# Patient Record
Sex: Male | Born: 1970 | Race: White | Hispanic: No | Marital: Single | Smoking: Never smoker
Health system: Southern US, Community
[De-identification: ages and names within clinical notes are randomized; demographics above are authoritative.]

## PROBLEM LIST (undated history)

## (undated) DIAGNOSIS — G4733 Obstructive sleep apnea (adult) (pediatric): Secondary | ICD-10-CM

## (undated) DIAGNOSIS — Q2730 Arteriovenous malformation, site unspecified: Secondary | ICD-10-CM

## (undated) DIAGNOSIS — E041 Nontoxic single thyroid nodule: Secondary | ICD-10-CM

## (undated) DIAGNOSIS — J309 Allergic rhinitis, unspecified: Secondary | ICD-10-CM

## (undated) DIAGNOSIS — N471 Phimosis: Secondary | ICD-10-CM

## (undated) DIAGNOSIS — G2581 Restless legs syndrome: Secondary | ICD-10-CM

## (undated) DIAGNOSIS — L719 Rosacea, unspecified: Secondary | ICD-10-CM

## (undated) DIAGNOSIS — E782 Mixed hyperlipidemia: Secondary | ICD-10-CM

## (undated) DIAGNOSIS — E669 Obesity, unspecified: Secondary | ICD-10-CM

## (undated) HISTORY — PX: APPENDECTOMY: SHX54

## (undated) HISTORY — DX: Nontoxic single thyroid nodule: E04.1

## (undated) HISTORY — DX: Mixed hyperlipidemia: E78.2

## (undated) HISTORY — DX: Obesity, unspecified: E66.9

## (undated) HISTORY — PX: NASAL SEPTUM SURGERY: SHX37

## (undated) HISTORY — DX: Allergic rhinitis, unspecified: J30.9

## (undated) HISTORY — DX: Rosacea, unspecified: L71.9

## (undated) HISTORY — DX: Restless legs syndrome: G25.81

## (undated) HISTORY — DX: Arteriovenous malformation, site unspecified: Q27.30

## (undated) HISTORY — DX: Phimosis: N47.1

## (undated) HISTORY — DX: Obstructive sleep apnea (adult) (pediatric): G47.33

## (undated) HISTORY — PX: OTHER SURGICAL HISTORY: SHX169

---

## 2006-08-01 ENCOUNTER — Encounter: Admission: RE | Admit: 2006-08-01 | Discharge: 2006-08-01 | Payer: Self-pay | Admitting: Otolaryngology

## 2006-08-16 ENCOUNTER — Other Ambulatory Visit: Admission: RE | Admit: 2006-08-16 | Discharge: 2006-08-16 | Payer: Self-pay | Admitting: Otolaryngology

## 2006-09-12 ENCOUNTER — Ambulatory Visit (HOSPITAL_COMMUNITY): Admission: RE | Admit: 2006-09-12 | Discharge: 2006-09-13 | Payer: Self-pay | Admitting: Otolaryngology

## 2006-09-12 ENCOUNTER — Encounter (INDEPENDENT_AMBULATORY_CARE_PROVIDER_SITE_OTHER): Payer: Self-pay | Admitting: Specialist

## 2009-02-16 ENCOUNTER — Ambulatory Visit: Payer: Self-pay | Admitting: Orthopedic Surgery

## 2009-02-16 DIAGNOSIS — M7580 Other shoulder lesions, unspecified shoulder: Secondary | ICD-10-CM

## 2009-02-16 DIAGNOSIS — M25819 Other specified joint disorders, unspecified shoulder: Secondary | ICD-10-CM | POA: Insufficient documentation

## 2009-02-16 DIAGNOSIS — M25519 Pain in unspecified shoulder: Secondary | ICD-10-CM | POA: Insufficient documentation

## 2009-02-22 ENCOUNTER — Encounter: Payer: Self-pay | Admitting: Orthopedic Surgery

## 2009-05-18 ENCOUNTER — Encounter: Admission: RE | Admit: 2009-05-18 | Discharge: 2009-05-18 | Payer: Self-pay | Admitting: Orthopedic Surgery

## 2009-08-25 ENCOUNTER — Observation Stay (HOSPITAL_COMMUNITY): Admission: EM | Admit: 2009-08-25 | Discharge: 2009-08-26 | Payer: Self-pay | Admitting: Emergency Medicine

## 2009-08-26 ENCOUNTER — Encounter (INDEPENDENT_AMBULATORY_CARE_PROVIDER_SITE_OTHER): Payer: Self-pay | Admitting: Emergency Medicine

## 2010-10-02 ENCOUNTER — Encounter: Payer: Self-pay | Admitting: Family Medicine

## 2010-10-03 ENCOUNTER — Encounter: Payer: Self-pay | Admitting: Orthopedic Surgery

## 2010-12-13 LAB — BASIC METABOLIC PANEL
Creatinine, Ser: 0.9 mg/dL (ref 0.4–1.5)
GFR calc Af Amer: >60 mL/min (ref >60)
GFR calc non Af Amer: >60 mL/min (ref >60)

## 2010-12-13 LAB — POCT CARDIAC MARKERS
CKMB, poc: 1.9 ng/mL (ref 1.0–8.0)
Myoglobin, poc: 187 ng/mL (ref 12–200)

## 2010-12-13 LAB — D-DIMER, QUANTITATIVE: D-Dimer, Quant: 0.63 ug/mL-FEU — ABNORMAL HIGH (ref 0.00–0.48)

## 2010-12-13 LAB — CBC
Hemoglobin: 14.3 g/dL (ref 13.0–17.0)
MCHC: 34.2 g/dL (ref 30.0–36.0)
MCV: 84.3 fL (ref 78.0–100.0)
Platelets: 234 10*3/uL (ref 150–400)
RBC: 4.94 MIL/uL (ref 4.22–5.81)
RDW: 12.9 % (ref 11.5–15.5)

## 2010-12-13 LAB — CK TOTAL AND CKMB (NOT AT ARMC)
CK, MB: 2.6 ng/mL (ref 0.3–4.0)
CK, MB: 3 ng/mL (ref 0.3–4.0)
Relative Index: 1.3 (ref 0.0–2.5)
Total CK: 206 U/L (ref 7–232)
Total CK: 238 U/L — ABNORMAL HIGH (ref 7–232)

## 2010-12-13 LAB — DIFFERENTIAL
Basophils Relative: 1 % (ref 0–1)
Neutro Abs: 4.7 10*3/uL (ref 1.7–7.7)

## 2010-12-13 LAB — TROPONIN I: Troponin I: 0.01 ng/mL (ref 0.00–0.06)

## 2011-01-27 NOTE — Op Note (Signed)
Don Mcdonald, Don Mcdonald               ACCOUNT NO.:  [id number]   MEDICAL RECORD NO.:  [id number]          PATIENT TYPE:  OIB   LOCATION:  2550                         FACILITY:  [facility]   PHYSICIAN:  Jefry H. Pollyann Kennedy, MD     DATE OF BIRTH:  05-29-1971   DATE OF PROCEDURE:  09/12/2006  DATE OF DISCHARGE:                               OPERATIVE REPORT   PREOPERATIVE DIAGNOSIS:  Left thyroid mass.   POSTOPERATIVE DIAGNOSIS:  Left thyroid mass.   PROCEDURE:  Left thyroid lobectomy.   SURGEON:  Jefry H. Pollyann Kennedy, MD.   ASSISTANTGloris Manchester. Lazarus Salines, M.D.   COMPLICATIONS:  None.   BLOOD LOSS:  None.   FINDINGS:  Large circumscribed mass approximately 3.5 to 4 cm involving  the majority of the left thyroid lobe with a subcentimeter prelaryngeal  lymph node present just to the right of midline.  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   FINAL DIAGNOSIS:  Deferred for permanent section evaluation.   REFERRING PHYSICIAN:  Dr. Johnn Hai.   HISTORY:  This is a 40 year old with an incidentally found left thyroid  mass.  It was solid and solitary on ultrasound and needle aspiration  biopsy revealed follicular epithelium.  Risks, benefits, alternatives,  complications of procedure explained to the patient, who seemed to  understand and agreed to surgery.   PROCEDURE:  The patient was taken to the operating room, placed on the  operating table in supine position.  Following induction of general  endotracheal anesthesia, the patient was prepped and draped in standard  fashion.  A low collar incision was outlined with marking pen and  electrocautery was used to incise the skin and through the subcutaneous  tissue and through the superficial fascia and platysma.  Subplatysmal  flaps were developed superiorly up to the thyroid notch and inferiorly  to the clavicle.  A self-retaining thyroid retractor was used for the  remainder of  the case.  The midline fascia was divided and the strap  muscles were dissected off of the left thyroid lobe.  The left thyroid  lobe was then dissected in a pericapsular plane initially clamping off  and tying the superior vasculature.  A putative parathyroid was  identified and preserved with its blood supply adjacent to the entry  point of the recurrent nerve into the larynx on the left.  The middle  thyroid vein was ligated between clamps and divided.  The inferior  vasculature was then similarly ligated and divided.  The ligament of  Allyson Sabal was divided using electrocautery.  Small vessels were coagulated  with cautery unit.  The recurrent nerve was identified and preserved.  The isthmus was divided using electrocautery.  The left lobe was sent  for pathologic evaluation.  The prelaryngeal lymph node that was  identified was simply dissected out using cautery and was sent  separately with the specimen.  The wound was irrigated with saline and  hemostasis was completed using bipolar cautery as needed.  The wound was  closed in layers using 3-0 chromic on the midline  fascia and the  platysma layer and Dermabond on the skin.  Subcuticular closure was also  accomplished using the chromic.  A 7-French round JP drain was left in  the wound, exited through the right side of the incision, secured in  place with a nylon suture.  The patient was awakened from anesthesia,  extubated, transferred to recovery in stable condition.      Jefry H. Pollyann Kennedy, MD  Electronically Signed     JHR/MEDQ  D:  09/12/2006  T:  09/12/2006  Job:  [id number]   cc:   Dr. Johnn Hai

## 2013-11-07 ENCOUNTER — Other Ambulatory Visit: Payer: Self-pay | Admitting: Sports Medicine

## 2013-11-07 DIAGNOSIS — M25551 Pain in right hip: Secondary | ICD-10-CM

## 2013-11-28 ENCOUNTER — Ambulatory Visit
Admission: RE | Admit: 2013-11-28 | Discharge: 2013-11-28 | Disposition: A | Discharge status: Home / Self Care | Payer: No Typology Code available for payment source | Source: Ambulatory Visit | Attending: Sports Medicine | Admitting: Sports Medicine

## 2013-11-28 DIAGNOSIS — M25551 Pain in right hip: Secondary | ICD-10-CM

## 2013-11-28 MED ORDER — IOHEXOL 180 MG/ML  SOLN
15.0000 mL | Freq: Once | INTRAMUSCULAR | Status: AC | PRN
  Administered 2013-11-28: 15 mL via INTRA_ARTICULAR

## 2014-02-13 ENCOUNTER — Encounter: Payer: Self-pay | Admitting: Internal Medicine

## 2014-02-13 ENCOUNTER — Ambulatory Visit (INDEPENDENT_AMBULATORY_CARE_PROVIDER_SITE_OTHER): Payer: No Typology Code available for payment source | Admitting: Internal Medicine

## 2014-02-13 ENCOUNTER — Encounter (INDEPENDENT_AMBULATORY_CARE_PROVIDER_SITE_OTHER): Payer: Self-pay

## 2014-02-13 VITALS — BP 122/74 | HR 75 | Ht 75.0 in | Wt 382.0 lb

## 2014-02-13 DIAGNOSIS — G4733 Obstructive sleep apnea (adult) (pediatric): Secondary | ICD-10-CM | POA: Insufficient documentation

## 2014-02-13 DIAGNOSIS — G4700 Insomnia, unspecified: Secondary | ICD-10-CM

## 2014-02-13 MED ORDER — CLONAZEPAM 0.5 MG PO TABS: ORAL_TABLET | ORAL | Status: DC

## 2014-02-13 NOTE — Assessment & Plan Note (Signed)
CPAP 12 appearss to be effective and well tolerated

## 2014-02-13 NOTE — Progress Notes (Signed)
02/13/14 42 yoM never smoker FOLLOWS FOR: Referred by Dr. Paulino Rily for inability to stay asleep throughout night. Pt states he wakes up during the night and has a hard time falling back to sleep. Epworth= 2 Known OSA on CPAP12/ [facility]- comfortable and used all night every night. Complains of difficulty initiating and maintaining sleep x 20 years. Bedtime 8-9 PM. Gets anxious about ability to sleep as bedtime approaches. Latency 1/2 hr, then wakes 3-4 times/ night for ?why? 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 Not much day-time sleepines. No caffeine. Occ brief nap- wakes spontaneously. If can't sleep will lie in bed, because if he gets up to read "that's it".  Bedroom cool, dark, quiet, bed comfortable, bed partner not disruptive. Has tried restoril, Vernon Prey  Prior to Admission medications   Medication Sig Start Date End Date Taking? Authorizing Provider  clonazePAM (KLONOPIN) 0.5 MG tablet 1-4 tabs, 1 hour before bedtime, if needed for sleep 02/13/14   Waymon Budge, MD   Past Medical History  Diagnosis Date  . Obesity   . Allergic rhinitis   . Left thyroid nodule     excision of nodule in 2008  . Phimosis   . OSA (obstructive sleep apnea)   . Rosacea   . RLS (restless legs syndrome)   . AVM (arteriovenous malformation)     right avm  . Mixed hyperlipidemia    Past Surgical History  Procedure Laterality Date  . Thyroid nodule      removal 2008  . Appendectomy    . Nasal septum surgery     Family History  Problem Relation Age of Onset  . Heart failure Father   . Diabetes Mother    History   Social History  . Marital Status: Single    Spouse Name: N/A    Number of Children: N/A  . Years of Education: N/A   Occupational History  . controller    Social History Main Topics  . Smoking status: Never Smoker   . Smokeless tobacco: Never Used  . Alcohol Use: No  . Drug Use: No  . Sexual Activity: Not on file    Other Topics Concern  . Not on file   Social History Narrative  . No narrative on file   ROS-see HPI Constitutional:   No-   weight loss, night sweats, fevers, chills, fatigue, lassitude. HEENT:   No-  headaches, difficulty swallowing, tooth/dental problems, sore throat,       No-  sneezing, itching, ear ache, nasal congestion, post nasal drip,  CV:  No-   chest pain, orthopnea, PND, swelling in lower extremities, anasarca,                                  dizziness, palpitations Resp: No-   shortness of breath with exertion or at rest.              No-   productive cough,  No non-productive cough,  No- coughing up of blood.              No-   change in color of mucus.  No- wheezing.   Skin: No-   rash or lesions. GI:  No-   heartburn, indigestion, abdominal pain, nausea, vomiting, diarrhea,                 change in bowel habits, loss  of appetite GU: No-   dysuria, change in color of urine, no urgency or frequency.  No- flank pain. MS:  No-   joint pain or swelling.  No- decreased range of motion.  No- back pain. Neuro-     nothing unusual Psych:  No- change in mood or affect. No depression or +anxiety.  No memory loss.  OBJ- Physical Exam- Tall/ very big man General- Alert, Oriented, Affect-appropriate, Distress- none acute Skin- rash-none, lesions- none, excoriation- none. Ruddy complexion Lymphadenopathy- none Head- atraumatic            Eyes- Gross vision intact, PERRLA, conjunctivae and secretions clear            Ears- Hearing, canals-normal            Nose- Clear, no-Septal dev, mucus, polyps, erosion, perforation             Throat- Mallampati II-III , mucosa clear , drainage- none, tonsils- atrophic Neck- flexible , trachea midline, no stridor , thyroid nl, carotid no bruit Chest - symmetrical excursion , unlabored           Heart/CV- RRR , no murmur , no gallop  , no rub, nl s1 s2                           - JVD- none , edema- none, stasis changes- none, varices-  none           Lung- clear to P&A, wheeze- none, cough- none , dullness-none, rub- none           Chest wall-  Abd- tender-no, distended-no, bowel sounds-present, HSM- no Br/ Gen/ Rectal- Not done, not indicated Extrem- cyanosis- none, clubbing, none, atrophy- none, strength- nl Neuro- grossly intact to observation

## 2014-02-13 NOTE — Assessment & Plan Note (Signed)
Sleep schedule 8PM-3AM suggests possible sleep schedule problem but he doesn't know that he would choose a different schedule. Sleep environment ok. Uncontrolled OSA often associated with nonspecific awakenings, but that doesn't seem true here. Plan- Try clonazepam for longer half life. Anticiptate he may need higher than usual dose due to his size. Consider adding more anxiolytic later. Consider cognitive behavioral therapy.

## 2014-02-13 NOTE — Patient Instructions (Signed)
Script for clonazepam. Try taking about an hour before bedtime. Check with [place] if this dose range doesn't seem right for you.

## 2014-02-27 ENCOUNTER — Encounter: Payer: Self-pay | Admitting: Internal Medicine

## 2014-02-27 MED ORDER — CLONAZEPAM 1 MG PO TABS: ORAL_TABLET | ORAL | Status: AC

## 2014-02-27 NOTE — Telephone Encounter (Signed)
klonpin 0.5 mg was last refilled 02/13/14 #30 x 1 refill.  1-4 tabs, 1 hour before bedtime, if needed for sleep Pending appt 03/30/14  Please advise Dr. Maple HudsonYoung thanks

## 2014-02-27 NOTE — Addendum Note (Signed)
Addended by: Tommie SamsSILVA, MINDY S on: 02/27/2014 03:44 PM   Modules accepted: Orders

## 2014-02-27 NOTE — Telephone Encounter (Signed)
I have sent email to pt.  I called RX into [facility]. Nothing further needed

## 2014-02-27 NOTE — Telephone Encounter (Signed)
Change klonopin script to 1 mg tabs, # 90, 1.5 -3 tabs per night for sleep as directed, ref x 3

## 2014-03-30 ENCOUNTER — Ambulatory Visit (INDEPENDENT_AMBULATORY_CARE_PROVIDER_SITE_OTHER): Payer: No Typology Code available for payment source | Admitting: Internal Medicine

## 2014-03-30 ENCOUNTER — Telehealth: Payer: Self-pay | Admitting: Internal Medicine

## 2014-03-30 ENCOUNTER — Encounter: Payer: Self-pay | Admitting: Internal Medicine

## 2014-03-30 VITALS — BP 120/86 | HR 108 | Ht 75.0 in | Wt 384.0 lb

## 2014-03-30 DIAGNOSIS — G4700 Insomnia, unspecified: Secondary | ICD-10-CM

## 2014-03-30 DIAGNOSIS — G4733 Obstructive sleep apnea (adult) (pediatric): Secondary | ICD-10-CM

## 2014-03-30 NOTE — Patient Instructions (Signed)
Order- [facility] - needs replacement for broken CPAP machine 12 cwp, mask of choice, humidifier, supplies        Dx OSA  Ok for now to continue clonazepam for sleep when needed. Call for refill if needed before next visit

## 2014-03-30 NOTE — Progress Notes (Signed)
02/13/14 42 yoM never smoker FOLLOWS FOR: Referred by Dr. Paulino RilyWolters for inability to stay asleep throughout night. Pt states he wakes up during the night and has a hard time falling back to sleep. Epworth= 2 Known OSA on CPAP12/ [facility]- comfortable and used all night every night. Complains of difficulty initiating and maintaining sleep x 20 years. Bedtime 8-9 PM. Gets anxious about ability to sleep as bedtime approaches. Latency 1/2 hr, then wakes 3-4 times/ night for ?why? 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 Not much day-time sleepines. No caffeine. Occ brief nap- wakes spontaneously. If can't sleep will lie in bed, because if he gets up to read "that's it".  Bedroom cool, dark, quiet, bed comfortable, bed partner not disruptive. Has tried restoril, Vernon Preyambien, lunesta  03/30/14- 42 yoM never smoker followed for Insomnia, OSA CPAP 12/ [facility] FOLLOW FOR:  Still waking up during the night but not as much; believes the medication is helping just overwhelmed at work. When calms down believes will be better.  Would continue with medication Broke CPAP machine12/ needs replacement. Clonazepam working better than [place] or [place].   ROS-see HPI Constitutional:   No-   weight loss, night sweats, fevers, chills, fatigue, lassitude. HEENT:   No-  headaches, difficulty swallowing, tooth/dental problems, sore throat,       No-  sneezing, itching, ear ache, nasal congestion, post nasal drip,  CV:  No-   chest pain, orthopnea, PND, swelling in lower extremities, anasarca,                                  dizziness, palpitations Resp: No-   shortness of breath with exertion or at rest.              No-   productive cough,  No non-productive cough,  No- coughing up of blood.              No-   change in color of mucus.  No- wheezing.   Skin: No-   rash or lesions. GI:  No-   heartburn, indigestion, abdominal pain, nausea, vomiting, GU: . MS:  No-   joint  pain or swelling.  Neuro-     nothing unusual Psych:  No- change in mood or affect. No depression or +anxiety.  No memory loss.  OBJ- Physical Exam- Tall/ very big man General- Alert, Oriented, Affect-appropriate, Distress- none acute Skin- rash-none, lesions- none, excoriation- none. Ruddy complexion Lymphadenopathy- none Head- atraumatic            Eyes- Gross vision intact, PERRLA, conjunctivae and secretions clear            Ears- Hearing, canals-normal            Nose- Clear, no-Septal dev, mucus, polyps, erosion, perforation             Throat- Mallampati II-III , mucosa clear , drainage- none, tonsils- atrophic Neck- flexible , trachea midline, no stridor , thyroid nl, carotid no bruit Chest - symmetrical excursion , unlabored           Heart/CV- RRR , no murmur , no gallop  , no rub, nl s1 s2                           - JVD- none , edema- none, stasis changes- none, varices- none  Lung- clear to P&A, wheeze- none, cough- none , dullness-none, rub- none           Chest wall-  Abd-  Br/ Gen/ Rectal- Not done, not indicated Extrem- cyanosis- none, clubbing, none, atrophy- none, strength- nl Neuro- grossly intact to observation

## 2014-03-30 NOTE — Telephone Encounter (Signed)
Pt has appt today with CY at 2:45 Pt aware that this can be addressed at his OV [facility] working with Dr Maple HudsonYoung today.  Has appt today but also needs Rx for cpap machine. Wanted to go ahead & leave message to see if could get process started. Uses [facility].   Nothing further needed.

## 2014-06-08 ENCOUNTER — Encounter: Payer: Self-pay | Admitting: Internal Medicine

## 2014-06-08 NOTE — Telephone Encounter (Signed)
If he has not tried [place], offer lunesta 2 mg, # 30, 1 for sleep as needed. It will likely cost more and may leave an aftertaste. If he has tried [place], then ask if he has ever used Trazodone, Seroquel or flurazepam.

## 2014-06-08 NOTE — Telephone Encounter (Signed)
Please advise on E-mail Dr Maple Hudson thanks

## 2014-06-09 NOTE — Telephone Encounter (Signed)
Offer trial of Seroquel 50 mg # 50 1 at bedtime first night, increasing if needed- 2 at hs second night, 3 on third night, 4 on 4th night, up to max of 6 per night.

## 2014-06-09 NOTE — Telephone Encounter (Addendum)
Dr Maple HudsonYoung please see e-mail.  Pt has tried and failed trazadone but has not taken the other two meds listed below.  Please advise which med you would like to Rx Thanks.

## 2014-06-09 NOTE — Telephone Encounter (Signed)
lmomtcb x 1 to make the pt aware of CY recs.

## 2014-06-10 MED ORDER — QUETIAPINE FUMARATE 50 MG PO TABS: ORAL_TABLET | ORAL | Status: DC

## 2014-06-10 NOTE — Telephone Encounter (Signed)
Spoke with pt and advised of Dr Young's recommendations.  Rx called to pharmacy. 

## 2014-06-10 NOTE — Addendum Note (Signed)
Addended by: Abigail MiyamotoPHELPS, Tramain Gershman D on: 06/10/2014 09:27 AM   Modules accepted: Orders

## 2014-06-18 ENCOUNTER — Encounter: Payer: Self-pay | Admitting: Internal Medicine

## 2014-06-18 NOTE — Telephone Encounter (Signed)
Please advise Dr. Young thanks 

## 2014-06-18 NOTE — Telephone Encounter (Signed)
If no better or worse with Seroquel, then stop it and lets watch off medication to see if any of the meds has really made any difference

## 2014-07-07 NOTE — Assessment & Plan Note (Signed)
Trying clonazepam instead of ambien

## 2014-07-07 NOTE — Assessment & Plan Note (Signed)
Plan- discuss w DME - replacement CPAP at 12

## 2014-07-08 NOTE — Telephone Encounter (Signed)
10.28 mychart message from pt: Message     I have not noticed any difference in my sleeping since going off the Seroquel.   Will forward to CY to make him aware there is no change in pt's sleep since stopping the Seroquel.  Pt is aware of this.

## 2014-07-09 NOTE — Telephone Encounter (Signed)
If sleep quality was not better with clonazepam or seroquel, then he can try Rx flurazepam/ [place] 30 mg, # 15, 1 at bedtime as needed. No ref for now.

## 2014-07-10 MED ORDER — FLURAZEPAM HCL 30 MG PO CAPS: 30.0000 mg | ORAL_CAPSULE | Freq: Every evening | ORAL | Status: DC | PRN

## 2014-07-10 NOTE — Addendum Note (Signed)
Addended by: Reynaldo MiniumWELCHEL, KATIE C on: 07/10/2014 10:47 AM   Modules accepted: Orders

## 2014-07-24 NOTE — Telephone Encounter (Signed)
Ok to try on and off remaining supply of flurazepam/ Dal[place]e. If  meds don't make a difference, then I don't have another to offer at this time.

## 2014-09-02 ENCOUNTER — Encounter: Payer: Self-pay | Admitting: Internal Medicine

## 2014-09-29 ENCOUNTER — Ambulatory Visit: Payer: No Typology Code available for payment source | Admitting: Internal Medicine

## 2015-01-26 ENCOUNTER — Encounter: Payer: Self-pay | Admitting: Internal Medicine

## 2015-01-27 ENCOUNTER — Other Ambulatory Visit: Payer: Self-pay | Admitting: Internal Medicine

## 2015-01-27 DIAGNOSIS — G4733 Obstructive sleep apnea (adult) (pediatric): Secondary | ICD-10-CM

## 2015-01-28 ENCOUNTER — Encounter: Payer: Self-pay | Admitting: Internal Medicine

## 2015-01-28 ENCOUNTER — Ambulatory Visit (INDEPENDENT_AMBULATORY_CARE_PROVIDER_SITE_OTHER): Payer: No Typology Code available for payment source | Admitting: Internal Medicine

## 2015-01-28 VITALS — BP 126/74 | HR 92 | Ht 75.0 in | Wt 398.0 lb

## 2015-01-28 DIAGNOSIS — G4700 Insomnia, unspecified: Secondary | ICD-10-CM | POA: Diagnosis not present

## 2015-01-28 DIAGNOSIS — G4733 Obstructive sleep apnea (adult) (pediatric): Secondary | ICD-10-CM | POA: Diagnosis not present

## 2015-01-28 MED ORDER — SUVOREXANT 20 MG PO TABS: 1.0000 | ORAL_TABLET | Freq: Every day | ORAL | Status: AC

## 2015-01-28 NOTE — Assessment & Plan Note (Signed)
Pressure and compliance seem good. We're going to continue these settings. Weight loss is encouraged.

## 2015-01-28 NOTE — Progress Notes (Signed)
02/13/14 42 yoM never smoker FOLLOWS FOR: Referred by Dr. Paulino RilyWolters for inability to stay asleep throughout night. Pt states he wakes up during the night and has a hard time falling back to sleep. Epworth= 2 Known OSA on CPAP12/ [facility]- comfortable and used all night every night. Complains of difficulty initiating and maintaining sleep x 20 years. Bedtime 8-9 PM. Gets anxious about ability to sleep as bedtime approaches. Latency 1/2 hr, then wakes 3-4 times/ night for ?why? 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 Not much day-time sleepines. No caffeine. Occ brief nap- wakes spontaneously. If can't sleep will lie in bed, because if he gets up to read "that's it".  Bedroom cool, dark, quiet, bed comfortable, bed partner not disruptive. Has tried restoril, [place] Preyambien, lunesta  03/30/14- 42 yoM never smoker followed for Insomnia, OSA CPAP 12/ [facility] FOLLOW FOR:  Still waking up during the night but not as much; believes the medication is helping just overwhelmed at work. When calms down believes will be better.  Would continue with medication Broke CPAP machine12/ needs replacement. Clonazepam working better than [place] or [place].   01/28/15- 42 yoM never smoker followed for Insomnia, OSA CPAP 12/ [facility] Same issues not sleeping through the night,clonazepam 1-3 mg is not helping  Fustrating and anxiety. Has tried restoril, ambien, lunesta, clonazepam, trazadone. Feels okay with CPAP at 12/[facility]. Problems falling asleep and maintaining sleep. We reviewed his experience with sleep medications and reemphasized basic sleep hygiene Breathing is okay. Sometimes right nostril clogs up. He has seen ENT about this. Occasional use of decongestant nasal spray.  ROS-see HPI Constitutional:   No-   weight loss, night sweats, fevers, chills, fatigue, lassitude. HEENT:   No-  headaches, difficulty swallowing, tooth/dental problems, sore throat,       No-   sneezing, itching, ear ache, nasal congestion, post nasal drip,  CV:  No-   chest pain, orthopnea, PND, swelling in lower extremities, anasarca,                                  dizziness, palpitations Resp: No-   shortness of breath with exertion or at rest.              No-   productive cough,  No non-productive cough,  No- coughing up of blood.              No-   change in color of mucus.  No- wheezing.   Skin: No-   rash or lesions. GI:  No-   heartburn, indigestion, abdominal pain, nausea, vomiting, GU: . MS:  No-   joint pain or swelling.  Neuro-     nothing unusual Psych:  No- change in mood or affect. No depression or +anxiety.  No memory loss.  OBJ- Physical Exam- Tall/ very big man, + obese, jiggling legs General- Alert, Oriented, Affect-appropriate, Distress- none acute Skin- rash-none, lesions- none, excoriation- none. Ruddy complexion Lymphadenopathy- none Head- atraumatic            Eyes- Gross vision intact, PERRLA, conjunctivae and secretions clear            Ears- Hearing, canals-normal            Nose- Clear, no-Septal dev, mucus, polyps, erosion, perforation             Throat- Mallampati II-III , mucosa clear , drainage- none,  tonsils- atrophic Neck- flexible , trachea midline, no stridor , thyroid nl, carotid no bruit Chest - symmetrical excursion , unlabored           Heart/CV- RRR , no murmur , no gallop  , no rub, nl s1 s2                           - JVD- none , edema- none, stasis changes- none, varices- none           Lung- clear to P&A, wheeze- none, cough- none , dullness-none, rub- none           Chest wall-  Abd-  Br/ Gen/ Rectal- Not done, not indicated Extrem- cyanosis- none, clubbing, none, atrophy- none, strength- nl Neuro- grossly intact to observation

## 2015-01-28 NOTE — Assessment & Plan Note (Signed)
Significant difficulty initiating and maintaining sleep. Appropriate counseling done. Any CPAP mask discomfort will only aggravate this. He has not found good solution with Restoril, Ambien, Lunesta, clonazepam, trazodone. Plan-discussed mechanisms of action. Try samples Belsomra 20 mg. Once he knows how this feels, he may try adding 1 mg of clonazepam for sedative effect if necessary.

## 2015-01-28 NOTE — Patient Instructions (Signed)
Samples Belsomra 20 mg at bedtime. If needed, you can try adding 1 mg Clonazepam, but I wouldn't add more than that for now.  We can continue CPAP 12/ [facility]  Order- CPAP download pressure compliance  Dx OSA  Please call as needed

## 2015-02-01 ENCOUNTER — Encounter: Payer: Self-pay | Admitting: Internal Medicine

## 2015-02-04 ENCOUNTER — Telehealth: Payer: Self-pay | Admitting: Internal Medicine

## 2015-02-04 MED ORDER — DOXEPIN HCL 25 MG PO CAPS: 25.0000 mg | ORAL_CAPSULE | Freq: Every day | ORAL | Status: AC

## 2015-02-04 NOTE — Telephone Encounter (Signed)
Called made pt aware of recs. When tryingt o send in doxepin for 20 mg it comes in 25 mg. Is this okay? thanks

## 2015-02-04 NOTE — Telephone Encounter (Signed)
RX sent in. Nothing further needed 

## 2015-02-04 NOTE — Telephone Encounter (Signed)
Use Doxepin 25 mg, # 30, 1 for sleep at bedtime, ref x 3

## 2015-02-04 NOTE — Telephone Encounter (Signed)
Per 01/28/15 OV; Patient Instructions       Samples Belsomra 20 mg at bedtime. If needed, you can try adding 1 mg Clonazepam, but I wouldn't add more than that for now  --  Spoke with pt. He reports the belsomra did nothing for him. On the 3rd day on, he added 1 mg of clonazepam and still did nothing for him. Requesting recs. Please advise CDY thanks  No allergies   Current Outpatient Prescriptions on File Prior to Visit  Medication Sig Dispense Refill  . clonazePAM (KLONOPIN) 1 MG tablet 1.5 -3 tabs per night for sleep as directed 90 tablet 3  . Suvorexant (BELSOMRA) 20 MG TABS Take 1 tablet by mouth daily. 6 tablet 0   No current facility-administered medications on file prior to visit.

## 2015-02-04 NOTE — Telephone Encounter (Signed)
Offer doxepin 20 mg # 30, 1 at bedtime for sleep

## 2015-02-12 ENCOUNTER — Encounter: Payer: Self-pay | Admitting: Internal Medicine

## 2015-02-12 NOTE — Telephone Encounter (Signed)
CY - please advise. Thanks! 

## 2015-02-18 NOTE — Telephone Encounter (Signed)
What medicine has he taken that he would like to back to? Who is he seeing now for psychiatrist?

## 2015-03-31 ENCOUNTER — Encounter: Payer: Self-pay | Admitting: Internal Medicine

## 2015-06-24 ENCOUNTER — Other Ambulatory Visit: Payer: Self-pay | Admitting: Family Medicine

## 2015-06-24 DIAGNOSIS — N50819 Testicular pain, unspecified: Secondary | ICD-10-CM

## 2015-06-24 DIAGNOSIS — N509 Disorder of male genital organs, unspecified: Principal | ICD-10-CM

## 2015-07-12 ENCOUNTER — Ambulatory Visit
Admission: RE | Admit: 2015-07-12 | Discharge: 2015-07-12 | Disposition: A | Discharge status: Home / Self Care | Payer: No Typology Code available for payment source | Source: Ambulatory Visit | Attending: Family Medicine | Admitting: Family Medicine

## 2015-07-12 DIAGNOSIS — N509 Disorder of male genital organs, unspecified: Principal | ICD-10-CM

## 2015-07-12 DIAGNOSIS — N50819 Testicular pain, unspecified: Secondary | ICD-10-CM

## 2015-08-02 ENCOUNTER — Ambulatory Visit: Payer: No Typology Code available for payment source | Admitting: Internal Medicine

## 2018-05-10 DIAGNOSIS — Z79899 Other long term (current) drug therapy: Secondary | ICD-10-CM | POA: Diagnosis not present

## 2018-05-10 DIAGNOSIS — E782 Mixed hyperlipidemia: Secondary | ICD-10-CM | POA: Diagnosis not present

## 2018-05-10 DIAGNOSIS — Z23 Encounter for immunization: Secondary | ICD-10-CM | POA: Diagnosis not present

## 2018-05-10 DIAGNOSIS — E041 Nontoxic single thyroid nodule: Secondary | ICD-10-CM | POA: Diagnosis not present

## 2018-05-10 DIAGNOSIS — E8881 Metabolic syndrome: Secondary | ICD-10-CM | POA: Diagnosis not present

## 2018-05-10 DIAGNOSIS — Z0000 Encounter for general adult medical examination without abnormal findings: Secondary | ICD-10-CM | POA: Diagnosis not present

## 2018-11-15 ENCOUNTER — Other Ambulatory Visit: Payer: Self-pay | Admitting: Orthopedic Surgery

## 2018-11-15 DIAGNOSIS — M25512 Pain in left shoulder: Secondary | ICD-10-CM | POA: Diagnosis not present

## 2018-11-15 DIAGNOSIS — M25511 Pain in right shoulder: Secondary | ICD-10-CM | POA: Diagnosis not present

## 2018-11-19 ENCOUNTER — Ambulatory Visit
Admission: RE | Admit: 2018-11-19 | Discharge: 2018-11-19 | Disposition: A | Discharge status: Home / Self Care | Payer: BLUE CROSS/BLUE SHIELD | Source: Ambulatory Visit | Attending: Orthopedic Surgery | Admitting: Orthopedic Surgery

## 2018-11-19 DIAGNOSIS — M25511 Pain in right shoulder: Secondary | ICD-10-CM

## 2019-07-22 DIAGNOSIS — E782 Mixed hyperlipidemia: Secondary | ICD-10-CM | POA: Diagnosis not present

## 2019-07-22 DIAGNOSIS — Z0000 Encounter for general adult medical examination without abnormal findings: Secondary | ICD-10-CM | POA: Diagnosis not present

## 2019-07-22 DIAGNOSIS — Z23 Encounter for immunization: Secondary | ICD-10-CM | POA: Diagnosis not present

## 2019-07-22 DIAGNOSIS — E041 Nontoxic single thyroid nodule: Secondary | ICD-10-CM | POA: Diagnosis not present

## 2019-07-22 DIAGNOSIS — Z79899 Other long term (current) drug therapy: Secondary | ICD-10-CM | POA: Diagnosis not present

## 2019-07-22 DIAGNOSIS — R7303 Prediabetes: Secondary | ICD-10-CM | POA: Diagnosis not present

## 2020-07-29 IMAGING — MR MRI OF THE RIGHT SHOULDER WITHOUT CONTRAST
5 series · 35 of 40 positions shown · non-contrast
Comparison: None.

CLINICAL DATA: Chronic right shoulder pain and limited range of
motion. No known injury.

EXAM:
MRI OF THE RIGHT SHOULDER WITHOUT CONTRAST
TECHNIQUE: Multiplanar, multisequence MR imaging of the shoulder was performed.
No intravenous contrast was administered.

[Series 3: PD fat-sat · axial · 4.0mm · 0.55mm/px · z∈[+28,+123]mm · 8 of 23 slices shown (1 of 2)]
[im 1/23]
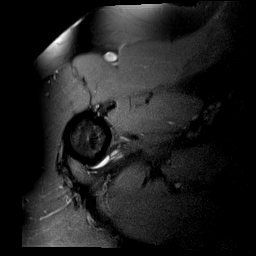
[im 4/23]
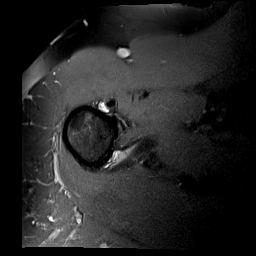
[im 7/23]
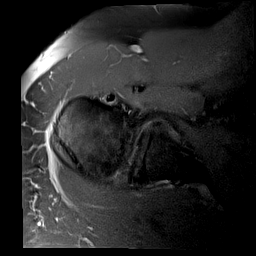
[im 10/23]
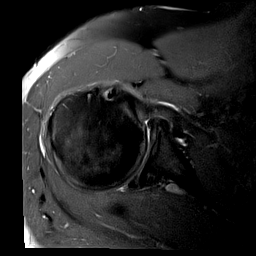
[im 13/23]
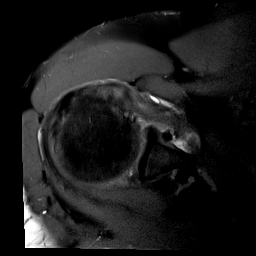
[im 16/23]
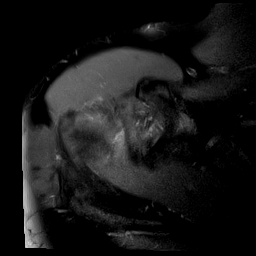
[im 19/23]
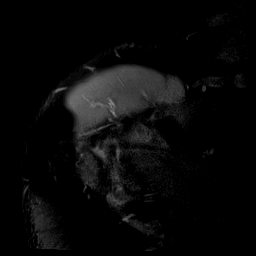
[im 23/23]
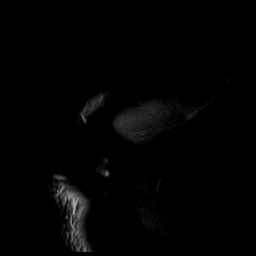

[Series 5: PD fat-sat · coronal · 4.0mm · 0.27mm/px · 8 of 19 slices shown (2 of 2)]
[im 1/19]
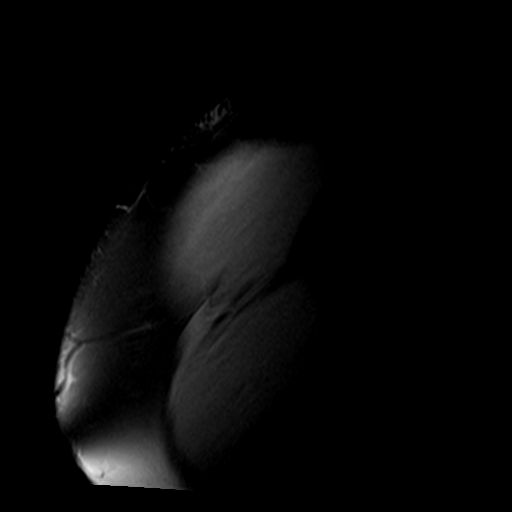
[im 3/19]
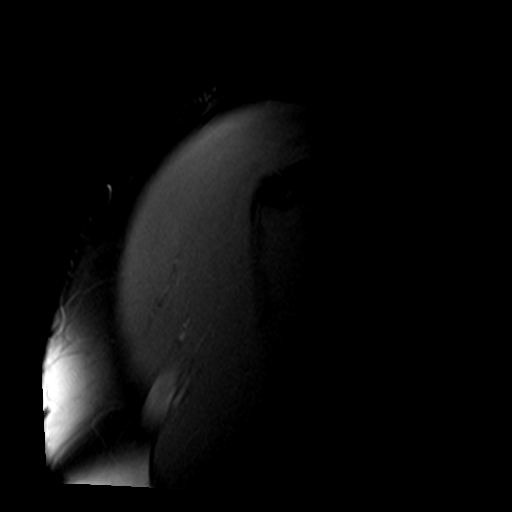
[im 6/19]
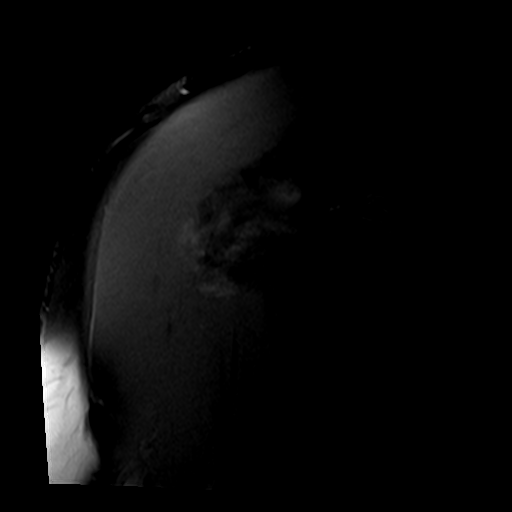
[im 8/19]
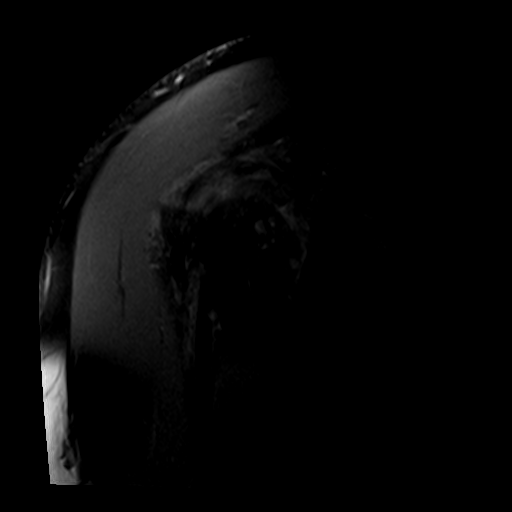
[im 11/19]
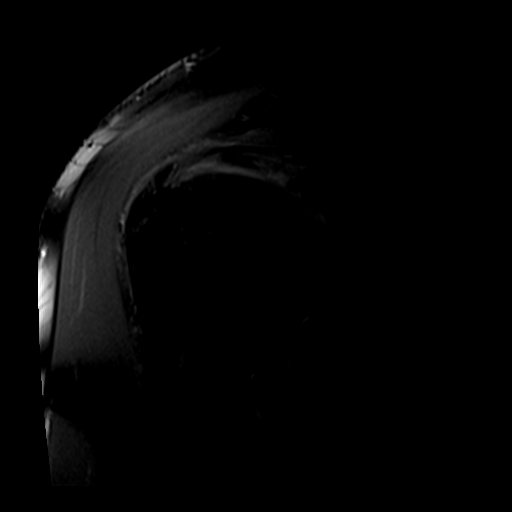
[im 13/19]
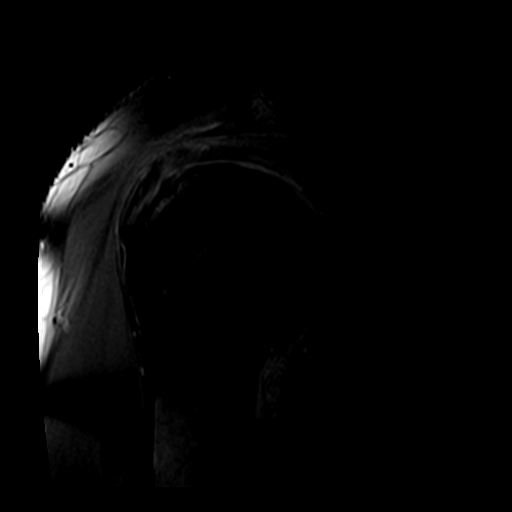
[im 16/19]
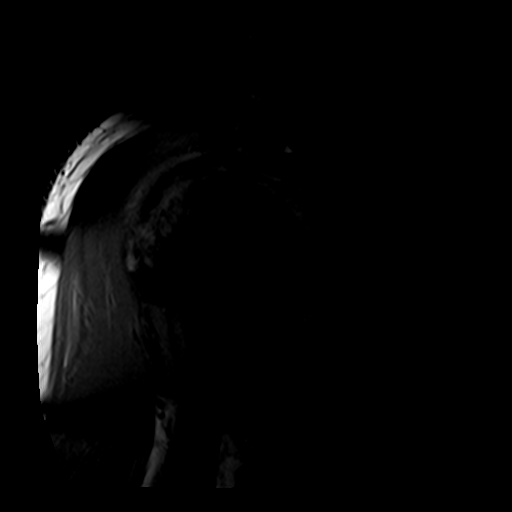
[im 19/19]
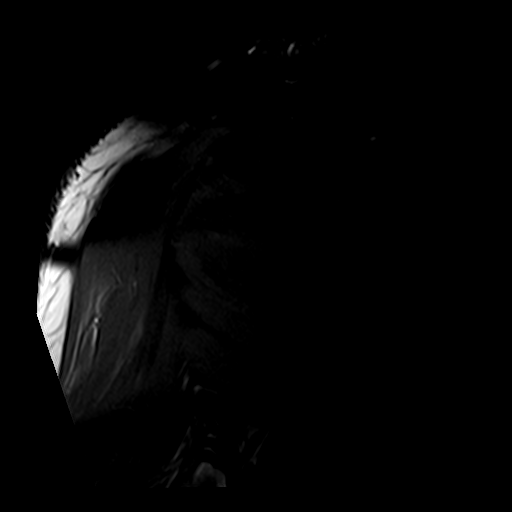

[Series 6: T2 fat-sat · coronal · 4.0mm · 0.55mm/px · 8 of 19 slices shown (1 of 2)]
[im 1/19]
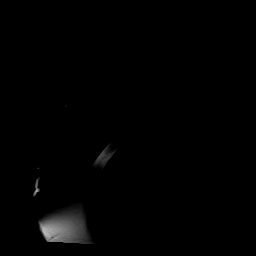
[im 3/19]
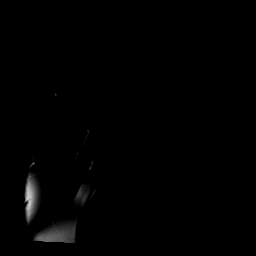
[im 6/19]
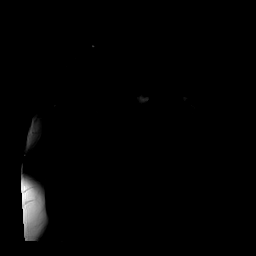
[im 8/19]
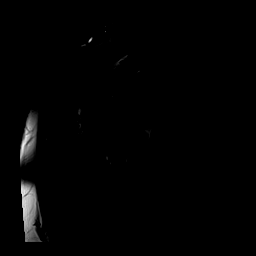
[im 11/19]
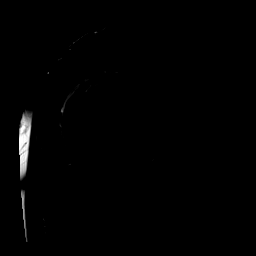
[im 13/19]
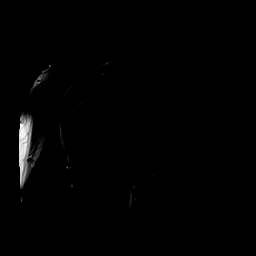
[im 16/19]
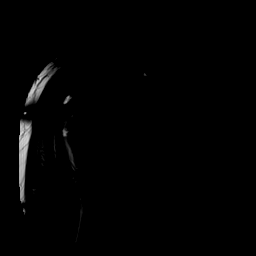
[im 19/19]
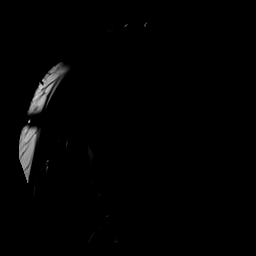

[Series 7: T2 fat-sat · sagittal · 4.0mm · 0.55mm/px · 8 of 20 slices shown (2 of 2)]
[im 1/20]
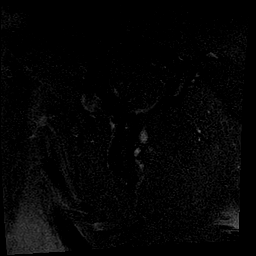
[im 3/20]
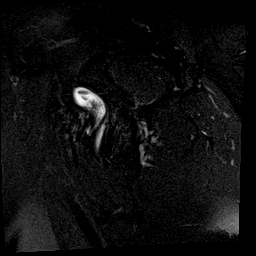
[im 6/20]
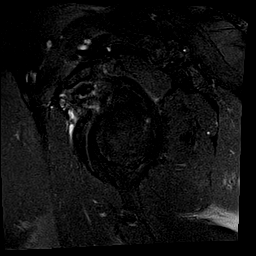
[im 9/20]
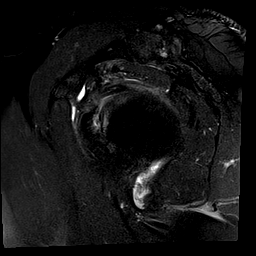
[im 11/20]
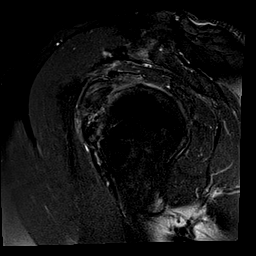
[im 14/20]
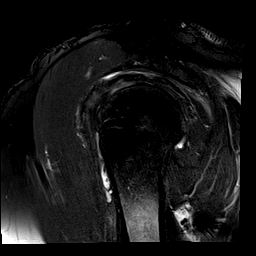
[im 17/20]
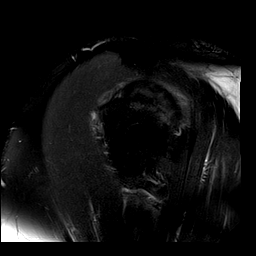
[im 20/20]
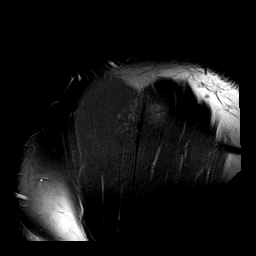

[Series 8: T1 · sagittal · 4.0mm · 0.27mm/px · 3 of 20 slices shown]
[im 1/20]
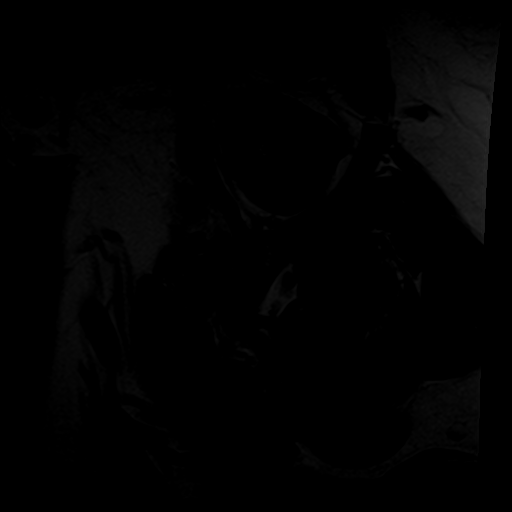
[im 3/20]
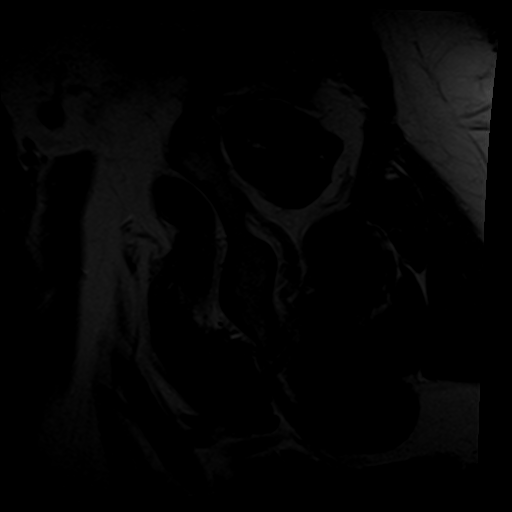
[im 6/20]
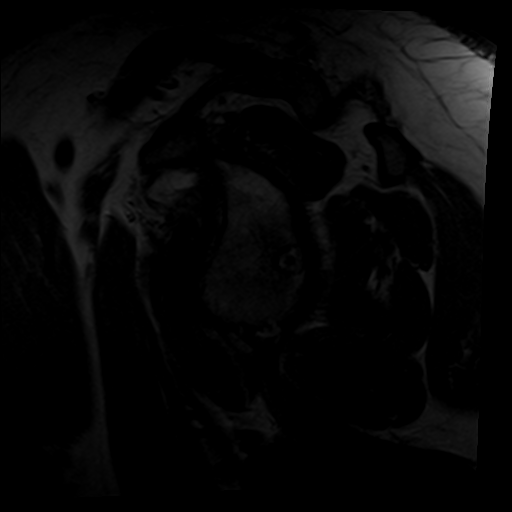

[35 of 40 positions shown; findings below may reference images not displayed]

FINDINGS: Rotator cuff: Heterogeneously increased T2 signal and thickening in
the rotator cuff tendons consistent with tendinopathy is identified.
No tear is seen.

Muscles:  Normal without atrophy or focal lesion.

Biceps long head: Intact. There is thickening and intrasubstance
increased T2 signal in the intra-articular segment of the tendon
consistent with tendinopathy.

Acromioclavicular Joint: Moderately severe degenerative change is
present. Type 1 acromion. Small volume of fluid is seen in the
subacromial/subdeltoid bursa.

Glenohumeral Joint: Degenerative change is present with cartilage
thinning and an osteophyte off the humeral head. Mild subchondral
edema is present about the joint. A 1 cm loose body in the
subscapularis recess is identified.

Labrum:  No tear is identified.  The labrum appears degenerated.

Bones:  No fracture or worrisome lesion.

Other: None.
IMPRESSION: Moderately severe appearing rotator cuff and intra-articular long
head of biceps tendinopathy without tear.

Moderately severe acromioclavicular and moderate to moderately
severe glenohumeral osteoarthritis is advanced for age. 1 cm loose
body in the subscapularis recess is noted.

Small volume of subacromial/subdeltoid fluid consistent with
bursitis.

## 2020-08-24 DIAGNOSIS — Z0000 Encounter for general adult medical examination without abnormal findings: Secondary | ICD-10-CM | POA: Diagnosis not present

## 2020-08-24 DIAGNOSIS — Z125 Encounter for screening for malignant neoplasm of prostate: Secondary | ICD-10-CM | POA: Diagnosis not present

## 2020-08-24 DIAGNOSIS — E782 Mixed hyperlipidemia: Secondary | ICD-10-CM | POA: Diagnosis not present

## 2020-08-24 DIAGNOSIS — E041 Nontoxic single thyroid nodule: Secondary | ICD-10-CM | POA: Diagnosis not present

## 2020-08-24 DIAGNOSIS — Z23 Encounter for immunization: Secondary | ICD-10-CM | POA: Diagnosis not present

## 2020-08-24 DIAGNOSIS — Z79899 Other long term (current) drug therapy: Secondary | ICD-10-CM | POA: Diagnosis not present

## 2020-08-24 DIAGNOSIS — R600 Localized edema: Secondary | ICD-10-CM | POA: Diagnosis not present

## 2020-08-24 DIAGNOSIS — R7303 Prediabetes: Secondary | ICD-10-CM | POA: Diagnosis not present

## 2020-08-25 DIAGNOSIS — R600 Localized edema: Secondary | ICD-10-CM | POA: Diagnosis not present

## 2020-08-25 DIAGNOSIS — Z1211 Encounter for screening for malignant neoplasm of colon: Secondary | ICD-10-CM | POA: Diagnosis not present

## 2020-08-25 DIAGNOSIS — M7989 Other specified soft tissue disorders: Secondary | ICD-10-CM | POA: Diagnosis not present

## 2021-02-03 DIAGNOSIS — Z1211 Encounter for screening for malignant neoplasm of colon: Secondary | ICD-10-CM | POA: Diagnosis not present

## 2021-02-03 DIAGNOSIS — K649 Unspecified hemorrhoids: Secondary | ICD-10-CM | POA: Diagnosis not present

## 2021-02-03 DIAGNOSIS — D125 Benign neoplasm of sigmoid colon: Secondary | ICD-10-CM | POA: Diagnosis not present

## 2021-07-13 DIAGNOSIS — M25562 Pain in left knee: Secondary | ICD-10-CM | POA: Diagnosis not present

## 2021-08-09 DIAGNOSIS — M25562 Pain in left knee: Secondary | ICD-10-CM | POA: Diagnosis not present

## 2021-08-12 DIAGNOSIS — S83242A Other tear of medial meniscus, current injury, left knee, initial encounter: Secondary | ICD-10-CM | POA: Diagnosis not present

## 2021-08-25 DIAGNOSIS — G8918 Other acute postprocedural pain: Secondary | ICD-10-CM | POA: Diagnosis not present

## 2021-08-25 DIAGNOSIS — M948X6 Other specified disorders of cartilage, lower leg: Secondary | ICD-10-CM | POA: Diagnosis not present

## 2021-08-25 DIAGNOSIS — X58XXXA Exposure to other specified factors, initial encounter: Secondary | ICD-10-CM | POA: Diagnosis not present

## 2021-08-25 DIAGNOSIS — S83242A Other tear of medial meniscus, current injury, left knee, initial encounter: Secondary | ICD-10-CM | POA: Diagnosis not present

## 2021-08-25 DIAGNOSIS — Y999 Unspecified external cause status: Secondary | ICD-10-CM | POA: Diagnosis not present

## 2021-08-25 DIAGNOSIS — S83232A Complex tear of medial meniscus, current injury, left knee, initial encounter: Secondary | ICD-10-CM | POA: Diagnosis not present

## 2022-04-12 DIAGNOSIS — H6593 Unspecified nonsuppurative otitis media, bilateral: Secondary | ICD-10-CM | POA: Diagnosis not present

## 2022-04-12 DIAGNOSIS — H6123 Impacted cerumen, bilateral: Secondary | ICD-10-CM | POA: Diagnosis not present

## 2022-04-20 DIAGNOSIS — H6981 Other specified disorders of Eustachian tube, right ear: Secondary | ICD-10-CM | POA: Diagnosis not present

## 2022-05-03 DIAGNOSIS — H938X1 Other specified disorders of right ear: Secondary | ICD-10-CM | POA: Diagnosis not present

## 2022-06-23 ENCOUNTER — Emergency Department (HOSPITAL_COMMUNITY): Payer: BC Managed Care – PPO

## 2022-06-23 ENCOUNTER — Emergency Department (HOSPITAL_COMMUNITY)
Admission: EM | Admit: 2022-06-23 | Discharge: 2022-06-24 | Disposition: A | Discharge status: Home / Self Care | Payer: BC Managed Care – PPO | Attending: Emergency Medicine | Admitting: Emergency Medicine

## 2022-06-23 ENCOUNTER — Other Ambulatory Visit: Payer: Self-pay

## 2022-06-23 ENCOUNTER — Encounter (HOSPITAL_COMMUNITY): Payer: Self-pay

## 2022-06-23 DIAGNOSIS — S93402A Sprain of unspecified ligament of left ankle, initial encounter: Secondary | ICD-10-CM | POA: Diagnosis not present

## 2022-06-23 DIAGNOSIS — S99912A Unspecified injury of left ankle, initial encounter: Secondary | ICD-10-CM | POA: Diagnosis not present

## 2022-06-23 DIAGNOSIS — M7989 Other specified soft tissue disorders: Secondary | ICD-10-CM | POA: Diagnosis not present

## 2022-06-23 DIAGNOSIS — Y9289 Other specified places as the place of occurrence of the external cause: Secondary | ICD-10-CM | POA: Insufficient documentation

## 2022-06-23 DIAGNOSIS — W108XXA Fall (on) (from) other stairs and steps, initial encounter: Secondary | ICD-10-CM | POA: Diagnosis not present

## 2022-06-23 DIAGNOSIS — M25572 Pain in left ankle and joints of left foot: Secondary | ICD-10-CM | POA: Diagnosis not present

## 2022-06-23 MED ORDER — NAPROXEN 250 MG PO TABS
500.0000 mg | ORAL_TABLET | Freq: Once | ORAL | Status: AC
  Administered 2022-06-23: 500 mg via ORAL
  Filled 2022-06-23: qty 2

## 2022-06-23 MED ORDER — BACITRACIN ZINC 500 UNIT/GM EX OINT
TOPICAL_OINTMENT | Freq: Two times a day (BID) | CUTANEOUS | Status: DC
  Filled 2022-06-23: qty 0.9

## 2022-06-23 MED ORDER — NAPROXEN 500 MG PO TABS: 500.0000 mg | ORAL_TABLET | Freq: Two times a day (BID) | ORAL | 0 refills | Status: AC

## 2022-06-23 NOTE — Discharge Instructions (Signed)
Ankle and leg xrays with NO fractures!  Apply a compressive ACE bandage. Rest and elevate the affected painful area.  Apply cold compresses intermittently as needed.  As pain recedes, begin normal activities slowly as tolerated.  Call if symptoms persist.  Please take Naprosyn, 500mg  by mouth twice daily as needed for pain - this in an antiinflammatory medicine (NSAID) and is similar to ibuprofen - many people feel that it is stronger than ibuprofen and it is easier to take since it is a smaller pill.  Please use this only for 1 week - if your pain persists, you will need to follow up with your doctor in the office for ongoing guidance and pain control.    Thank you for allowing [place] to treat you in the emergency department today.  After reviewing your examination and potential testing that was done it appears that you are safe to go home.  I would like for you to follow-up with your doctor within the next several days, have them obtain your results and follow-up with them to review all of these tests.  If you should develop severe or worsening symptoms return to the emergency department immediately

## 2022-06-23 NOTE — ED Provider Notes (Signed)
[facility] EMERGENCY DEPARTMENT Provider Note   CSN: [id number] Arrival date & time: 06/23/22  2105     History  No chief complaint on file.   Don Mcdonald is a 51 y.o. male.  HPI   Pt had a fall down 3 stairs at home - landed on left leg - with abrasions and ttp over the lateral maleolus - has ambulated with some pain, cleaned wound with peroxide - no other injuries.  Occurred just pta.  Home Medications Prior to Admission medications   Medication Sig Start Date End Date Taking? Authorizing Provider  naproxen (NAPROSYN) 500 MG tablet Take 1 tablet (500 mg total) by mouth 2 (two) times daily with a meal. 06/23/22  Yes Noemi Chapel, MD  clonazePAM (KLONOPIN) 1 MG tablet 1.5 -3 tabs per night for sleep as directed 02/27/14   Deneise Lever, MD  doxepin (SINEQUAN) 25 MG capsule Take 1 capsule (25 mg total) by mouth at bedtime. 02/04/15   Young, Kasandra Knudsen, MD  Suvorexant (BELSOMRA) 20 MG TABS Take 1 tablet by mouth daily. 01/28/15   Deneise Lever, MD      Allergies    Patient has no known allergies.    Review of Systems   Review of Systems  Musculoskeletal:  Positive for joint swelling. Negative for back pain and neck pain.  Skin:  Positive for wound.  Neurological:  Negative for weakness and numbness.    Physical Exam Updated Vital Signs BP 137/86 (BP Location: Right Arm)   Pulse (!) 106   Temp 98.1 F (36.7 C) (Oral)   Resp 19   SpO2 95%  Physical Exam Vitals and nursing note reviewed.  Constitutional:      Appearance: He is well-developed. He is not diaphoretic.  HENT:     Head: Normocephalic and atraumatic.  Eyes:     General:        Right eye: No discharge.        Left eye: No discharge.     Conjunctiva/sclera: Conjunctivae normal.  Pulmonary:     Effort: Pulmonary effort is normal. No respiratory distress.  Musculoskeletal:        General: Swelling, tenderness, deformity and signs of injury present.     Right lower leg: No edema.     Left lower  leg: No edema.     Comments: Mild ttp over lateral mal, abrasion on lateral lower leg - normal knee on left. No ttp over the foot on left.  Skin:    General: Skin is warm and dry.     Findings: No erythema or rash.  Neurological:     Mental Status: He is alert.     Coordination: Coordination normal.     Comments: Normal strength and sensation in the bilateral lower extremities.     ED Results / Procedures / Treatments   Labs (all labs ordered are listed, but only abnormal results are displayed) Labs Reviewed - No data to display  EKG None  Radiology DG Ankle Left Port  Result Date: 06/23/2022 CLINICAL DATA:  Fall, ankle pain. EXAM: PORTABLE LEFT ANKLE - 2 VIEW COMPARISON:  None Available. FINDINGS: There is no evidence of fracture, dislocation, or joint effusion. Small plantar calcaneal spurring. Soft tissue swelling about the lateral malleolus. IMPRESSION: Soft tissue swelling about the lateral malleolus. No acute fracture or dislocation. Electronically Signed   By: Keane Police D.O.   On: 06/23/2022 22:34   DG Tibia/Fibula Left Port  Result Date: 06/23/2022 CLINICAL  DATA:  Fall down the steps.  Left leg swelling. EXAM: PORTABLE LEFT TIBIA AND FIBULA - 2 VIEW COMPARISON:  None Available. FINDINGS: No acute fracture or dislocation. Osseous fragment about the tibial tuberosity suggesting prior Osgood-Schlatter disease. Patellofemoral joint space narrowing with marginal spurring suggesting moderate patellofemoral osteoarthritis. Soft tissues are unremarkable. IMPRESSION: No acute fracture or dislocation. Osseous fragment about the tibial tuberosity suggesting prior Osgood-Schlatter disease. Moderate patellofemoral osteoarthritis. Electronically Signed   By: Larose Hires D.O.   On: 06/23/2022 22:32    Procedures Procedures    Medications Ordered in ED Medications  bacitracin ointment (has no administration in time range)  naproxen (NAPROSYN) tablet 500 mg (has no administration in  time range)    ED Course/ Medical Decision Making/ A&P                           Medical Decision Making Amount and/or Complexity of Data Reviewed Radiology: ordered.   This patient presents to the ED for concern of ankle injury differential diagnosis includes fracture / dislocation / sprain    Additional history obtained:  Additional history obtained from familiy at bedside - corroborates story.   Imaging Studies ordered:  I ordered imaging studies including ankle / tib fib xrays  I independently visualized and interpreted imaging which showed no acute fratures I agree with the radiologist interpretation   Medicines ordered and prescription drug management:  I ordered medication including nsaids  for pain  Reevaluation of the patient after these medicines showed that the patient improved I have reviewed the patients home medicines and have made adjustments as needed   Problem List / ED Course:  Home with splint / crutchest and RICE therapy   Social Determinants of Health:   None   I have discussed with the patient at the bedside the results, and the meaning of these results.  They have expressed her understanding to the need for follow-up with primary care physician           Final Clinical Impression(s) / ED Diagnoses Final diagnoses:  Sprain of left ankle, unspecified ligament, initial encounter    Rx / DC Orders ED Discharge Orders          Ordered    naproxen (NAPROSYN) 500 MG tablet  2 times daily with meals        06/23/22 2254              Eber Hong, MD 06/23/22 2254

## 2022-06-23 NOTE — ED Triage Notes (Signed)
Pt states he fell down some steps. Pt reports left leg swelling and scratches.

## 2022-08-09 DIAGNOSIS — S93492A Sprain of other ligament of left ankle, initial encounter: Secondary | ICD-10-CM | POA: Diagnosis not present
